# Patient Record
Sex: Male | Born: 2000 | Race: Black or African American | Hispanic: No | Marital: Single | Smoking: Current every day smoker
Health system: Southern US, Community
[De-identification: ages and names within clinical notes are randomized; demographics above are authoritative.]

## PROBLEM LIST (undated history)

## (undated) ENCOUNTER — Ambulatory Visit

## (undated) DIAGNOSIS — J45909 Unspecified asthma, uncomplicated: Secondary | ICD-10-CM

---
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
## 2018-05-09 ENCOUNTER — Emergency Department (HOSPITAL_COMMUNITY)
Admission: EM | Admit: 2018-05-09 | Discharge: 2018-05-09 | Disposition: A | Discharge status: Home / Self Care | Payer: Medicaid Other | Attending: Emergency Medicine | Admitting: Emergency Medicine

## 2018-05-09 ENCOUNTER — Encounter (HOSPITAL_COMMUNITY): Payer: Self-pay | Admitting: Emergency Medicine

## 2018-05-09 DIAGNOSIS — J45909 Unspecified asthma, uncomplicated: Secondary | ICD-10-CM | POA: Insufficient documentation

## 2018-05-09 DIAGNOSIS — N342 Other urethritis: Secondary | ICD-10-CM

## 2018-05-09 DIAGNOSIS — Z79899 Other long term (current) drug therapy: Secondary | ICD-10-CM | POA: Insufficient documentation

## 2018-05-09 MED ORDER — CEFTRIAXONE SODIUM 250 MG IJ SOLR
250.0000 mg | Freq: Once | INTRAMUSCULAR | Status: AC
  Administered 2018-05-09: 250 mg via INTRAMUSCULAR
  Filled 2018-05-09: qty 250

## 2018-05-09 MED ORDER — AZITHROMYCIN 250 MG PO TABS
1000.0000 mg | ORAL_TABLET | Freq: Once | ORAL | Status: AC
  Administered 2018-05-09: 1000 mg via ORAL
  Filled 2018-05-09: qty 4

## 2018-05-09 MED ORDER — LIDOCAINE HCL (PF) 1 % IJ SOLN
INTRAMUSCULAR | Status: AC
  Administered 2018-05-09: 04:00:00
  Filled 2018-05-09: qty 2

## 2018-05-09 NOTE — ED Notes (Signed)
Pt unable to urinate at this time. Pt given something to drink per his request.

## 2018-05-09 NOTE — ED Triage Notes (Signed)
Pt with c/o burning with urination since Sunday. States he has been having unprotected sex with a new partner.

## 2018-05-09 NOTE — ED Provider Notes (Signed)
[facility] EMERGENCY DEPARTMENT Provider Note   CSN: [id number] Arrival date & time: 05/09/18  0319     History   Chief Complaint Chief Complaint  Patient presents with  . Dysuria    HPI Alexander Galvan is a 17 y.o. male.  The history is provided by the patient.  Dysuria   This is a new problem. The current episode started more than 2 days ago. The problem occurs every urination. The problem has been gradually worsening. The quality of the pain is described as burning. The pain is moderate. There has been no fever. Pertinent negatives include no vomiting. He has tried nothing for the symptoms.  Patient reports recent unprotected sex, now having dysuria and penile discharge  Past Medical History:  Diagnosis Date  . Asthma     There are no active problems to display for this patient.   History reviewed. No pertinent surgical history.      Home Medications    Prior to Admission medications   Medication Sig Start Date End Date Taking? Authorizing Provider  albuterol (PROVENTIL HFA;VENTOLIN HFA) 108 (90 Base) MCG/ACT inhaler Inhale 2 puffs into the lungs every 6 (six) hours as needed for wheezing or shortness of breath.    [provider]  cyclobenzaprine (FLEXERIL) 10 MG tablet Take 1 tablet (10 mg total) by mouth 3 (three) times daily as needed. 11/28/17   Triplett, Tammy, PA-C  ibuprofen (ADVIL,MOTRIN) 800 MG tablet Take 1 tablet (800 mg total) by mouth 3 (three) times daily. 11/28/17   Triplett, Babette Relic, PA-C    Family History No family history on file.  Social History Social History   Tobacco Use  . Smoking status: Never Smoker  . Smokeless tobacco: Never Used  Substance Use Topics  . Alcohol use: No  . Drug use: No     Allergies   Patient has no known allergies.   Review of Systems Review of Systems  Constitutional: Negative for fever.  Gastrointestinal: Negative for vomiting.  Genitourinary: Positive for dysuria.     Physical Exam Updated  Vital Signs BP (!) 146/81 (BP Location: Right Arm)   Pulse 70   Temp 98.1 F (36.7 C) (Oral)   Resp 17   SpO2 98%   Physical Exam CONSTITUTIONAL: Well developed/well nourished HEAD: Normocephalic/atraumatic ENMT: Mucous membranes moist NECK: supple no meningeal signs ABDOMEN: soft GU: Whitish penile discharge noted.  No penile lesions.  Nurse present for exam He is uncircumcised NEURO: Pt is awake/alert/appropriate, moves all extremitiesx4.   EXTREMITIES:  full ROM SKIN: warm, color normal PSYCH: no abnormalities of mood noted, alert and oriented to situation   ED Treatments / Results  Labs (all labs ordered are listed, but only abnormal results are displayed) Labs Reviewed  GC/CHLAMYDIA PROBE AMP ([place]) NOT AT [facility]    EKG None  Radiology No results found.  Procedures Procedures (including critical care time)  Medications Ordered in ED Medications  cefTRIAXone (ROCEPHIN) injection 250 mg (250 mg Intramuscular Given 05/09/18 0407)  azithromycin (ZITHROMAX) tablet 1,000 mg (1,000 mg Oral Given 05/09/18 0407)  lidocaine (PF) (XYLOCAINE) 1 % injection (  Given 05/09/18 0412)     Initial Impression / Assessment and Plan / ED Course  I have reviewed the triage vital signs and the nursing notes.   We will empirically treat for gonorrhea chlamydia. Discussed safe sex precautions and need to use condoms every time  Final Clinical Impressions(s) / ED Diagnoses   Final diagnoses:  Urethritis  ED Discharge Orders    None       Zadie RhineWickline, Lunette Tapp, MD 05/09/18 [phone]

## 2018-05-10 LAB — GC/CHLAMYDIA PROBE AMP (~~LOC~~) NOT AT ARMC
CHLAMYDIA, DNA PROBE: NEGATIVE
Neisseria Gonorrhea: POSITIVE — AB

## 2019-10-04 ENCOUNTER — Other Ambulatory Visit: Payer: Self-pay

## 2019-10-04 DIAGNOSIS — Z20822 Contact with and (suspected) exposure to covid-19: Secondary | ICD-10-CM

## 2019-10-05 LAB — NOVEL CORONAVIRUS, NAA: SARS-CoV-2, NAA: NOT DETECTED

## 2020-06-19 ENCOUNTER — Encounter (HOSPITAL_COMMUNITY): Payer: Self-pay | Admitting: Emergency Medicine

## 2020-06-19 ENCOUNTER — Other Ambulatory Visit: Payer: Self-pay

## 2020-06-19 ENCOUNTER — Emergency Department (HOSPITAL_COMMUNITY)
Admission: EM | Admit: 2020-06-19 | Discharge: 2020-06-19 | Disposition: A | Discharge status: Home / Self Care | Payer: BC Managed Care – PPO | Attending: Emergency Medicine | Admitting: Emergency Medicine

## 2020-06-19 DIAGNOSIS — R1310 Dysphagia, unspecified: Secondary | ICD-10-CM | POA: Diagnosis present

## 2020-06-19 DIAGNOSIS — J45909 Unspecified asthma, uncomplicated: Secondary | ICD-10-CM | POA: Insufficient documentation

## 2020-06-19 DIAGNOSIS — K122 Cellulitis and abscess of mouth: Secondary | ICD-10-CM | POA: Diagnosis not present

## 2020-06-19 MED ORDER — CLINDAMYCIN HCL 300 MG PO CAPS: 300.0000 mg | ORAL_CAPSULE | Freq: Three times a day (TID) | ORAL | 0 refills | Status: DC

## 2020-06-19 MED ORDER — PREDNISONE 50 MG PO TABS: ORAL_TABLET | ORAL | 0 refills | Status: DC

## 2020-06-19 MED ORDER — CLINDAMYCIN HCL 150 MG PO CAPS
300.0000 mg | ORAL_CAPSULE | Freq: Once | ORAL | Status: AC
  Administered 2020-06-19: 300 mg via ORAL
  Filled 2020-06-19: qty 2

## 2020-06-19 MED ORDER — DEXAMETHASONE 4 MG PO TABS
10.0000 mg | ORAL_TABLET | Freq: Once | ORAL | Status: AC
  Administered 2020-06-19: 10 mg via ORAL
  Filled 2020-06-19: qty 3

## 2020-06-19 NOTE — ED Triage Notes (Signed)
Pt reports he woke up with pain to the top of his mouth

## 2020-06-19 NOTE — ED Provider Notes (Signed)
[facility] EMERGENCY DEPARTMENT Provider Note   CSN: [id number] Arrival date & time: 06/19/20  0411     History Chief Complaint  Patient presents with  . mouth pain    Alexander Galvan is a 19 y.o. male.  The history is provided by the patient and a significant other.  Patient reports he woke up with difficulty swallowing.  He reports he went to bed in his usual health.  He reports he woke up and he felt there was something in the back of his throat.  He then noted that there was swelling in the back of his throat.  There is only mild pain.  No fevers or vomiting. He is able to swallow his fluids.  He has never had this before.  He is not taking daily medications.  No chest pain or shortness of breath.  No rash.  No known allergic reactions His course is stable.  It is worsened by swallowing     Past Medical History:  Diagnosis Date  . Asthma     There are no problems to display for this patient.   History reviewed. No pertinent surgical history.     No family history on file.  Social History   Tobacco Use  . Smoking status: Never Smoker  . Smokeless tobacco: Never Used  Vaping Use  . Vaping Use: Every day  Substance Use Topics  . Alcohol use: No  . Drug use: No    Home Medications Prior to Admission medications   Medication Sig Start Date End Date Taking? Authorizing Provider  clindamycin (CLEOCIN) 300 MG capsule Take 1 capsule (300 mg total) by mouth 3 (three) times daily. X 7 days 06/19/20   Zadie Rhine, MD  predniSONE (DELTASONE) 50 MG tablet One tablet PO daily for 4 days - start on Sunday 7/25 06/19/20   Zadie Rhine, MD  albuterol (PROVENTIL HFA;VENTOLIN HFA) 108 (90 Base) MCG/ACT inhaler Inhale 2 puffs into the lungs every 6 (six) hours as needed for wheezing or shortness of breath.  06/19/20  [provider]    Allergies    Patient has no known allergies.  Review of Systems   Review of Systems  Constitutional: Negative for fever.    HENT: Positive for sore throat and trouble swallowing. Negative for facial swelling.   Respiratory: Negative for shortness of breath.   Cardiovascular: Negative for chest pain.  Gastrointestinal: Negative for vomiting.  Skin: Negative for rash.  All other systems reviewed and are negative.   Physical Exam Updated Vital Signs BP (!) 135/85 (BP Location: Left Arm)   Pulse 63   Temp 97.7 F (36.5 C) (Oral)   Resp 16   Ht 1.753 m (5\' 9" )   Wt (!) 113.4 kg   SpO2 96%   BMI 36.92 kg/m   Physical Exam CONSTITUTIONAL: Well developed/well nourished HEAD: Normocephalic/atraumatic EYES: EOMI/PERRL ENMT: Mucous membranes moist, there is no lip or tongue swelling.  Uvula is mildly  enlarged and erythematous.  No drooling.  No stridor.  No trismus.  Normal phonation is noted NECK: supple no meningeal signs CV: S1/S2 noted, no murmurs/rubs/gallops noted LUNGS: Lungs are clear to auscultation bilaterally, no apparent distress ABDOMEN: soft, nontender NEURO: Pt is awake/alert/appropriate, moves all extremitiesx4.  No facial droop.   EXTREMITIES: pulses normal/equal, full ROM SKIN: warm, color normal PSYCH: no abnormalities of mood noted, alert and oriented to situation ED Results / Procedures / Treatments   Labs (all labs ordered are listed, but only abnormal  results are displayed) Labs Reviewed - No data to display  EKG None  Radiology No results found.  Procedures Procedures  Medications Ordered in ED Medications  dexamethasone (DECADRON) tablet 10 mg (10 mg Oral Given 06/19/20 0622)  clindamycin (CLEOCIN) capsule 300 mg (300 mg Oral Given 06/19/20 5400)    ED Course  I have reviewed the triage vital signs and the nursing notes.      MDM Rules/Calculators/A&P                          Pt with Uvulitis of unclear etiology. Overall well-appearing.  He took medicines and PO fluids without difficulty.  No drooling or stridor.  Patient is requesting discharge home.  Will  start on steroids as well as antibiotics. We discussed return precautions.  Final Clinical Impression(s) / ED Diagnoses Final diagnoses:  Uvulitis    Rx / DC Orders ED Discharge Orders         Ordered    clindamycin (CLEOCIN) 300 MG capsule  3 times daily     Discontinue  Reprint     06/19/20 0603    predniSONE (DELTASONE) 50 MG tablet     Discontinue  Reprint     06/19/20 0603           Zadie Rhine, MD 06/19/20 [phone]

## 2021-01-28 ENCOUNTER — Other Ambulatory Visit: Payer: Self-pay

## 2021-01-28 ENCOUNTER — Encounter: Payer: Self-pay | Admitting: Emergency Medicine

## 2021-01-28 ENCOUNTER — Ambulatory Visit
Admission: EM | Admit: 2021-01-28 | Discharge: 2021-01-28 | Disposition: A | Discharge status: Home / Self Care | Payer: BC Managed Care – PPO | Attending: Emergency Medicine | Admitting: Emergency Medicine

## 2021-01-28 DIAGNOSIS — R300 Dysuria: Secondary | ICD-10-CM | POA: Diagnosis present

## 2021-01-28 DIAGNOSIS — Z202 Contact with and (suspected) exposure to infections with a predominantly sexual mode of transmission: Secondary | ICD-10-CM | POA: Insufficient documentation

## 2021-01-28 LAB — POCT URINALYSIS DIP (MANUAL ENTRY)
Bilirubin, UA: NEGATIVE
Blood, UA: NEGATIVE
Glucose, UA: NEGATIVE mg/dL
Nitrite, UA: NEGATIVE
Spec Grav, UA: >=1.03 — AB (ref 1.010–1.025)
Urobilinogen, UA: 1 E.U./dL
pH, UA: 6.5 (ref 5.0–8.0)

## 2021-01-28 MED ORDER — PHENAZOPYRIDINE HCL 100 MG PO TABS: 100.0000 mg | ORAL_TABLET | Freq: Three times a day (TID) | ORAL | 0 refills | Status: DC | PRN

## 2021-01-28 NOTE — ED Provider Notes (Addendum)
[facility] CARE CENTER   [id number] 01/28/21 Arrival Time: 1512   Chief Complaint  Patient presents with  . Dysuria    SUBJECTIVE:  Alexander Galvan is a 20 y.o. male who presented to the urgent care with a complaint of dysuria for the past 2 weeks.  Denies precipitating event, recent sexual encounter or recent antibiotic use.Marland Kitchen  He is sexually active with 1 male partner.  Denies penile discharge.  Has tried OTC medication with no relief.  Denies any alleviating or aggravating factors.  Denies similar symptoms in the past.  Denies fever, chills, nausea, vomiting, abdominal or pelvic pain, urinary symptoms, dyspareunia, penile rashes or lesions.   No LMP for male patient. Current birth control method: Compliant with BC:  ROS: As per HPI.  All other pertinent ROS negative.     Past Medical History:  Diagnosis Date  . Asthma    History reviewed. No pertinent surgical history. No Known Allergies No current facility-administered medications on file prior to encounter.   Current Outpatient Medications on File Prior to Encounter  Medication Sig Dispense Refill  . clindamycin (CLEOCIN) 300 MG capsule Take 1 capsule (300 mg total) by mouth 3 (three) times daily. X 7 days 21 capsule 0  . predniSONE (DELTASONE) 50 MG tablet One tablet po daily 4 tablet 0  . [DISCONTINUED] albuterol (PROVENTIL HFA;VENTOLIN HFA) 108 (90 Base) MCG/ACT inhaler Inhale 2 puffs into the lungs every 6 (six) hours as needed for wheezing or shortness of breath.      Social History   Socioeconomic History  . Marital status: Single    Spouse name: Not on file  . Number of children: Not on file  . Years of education: Not on file  . Highest education level: Not on file  Occupational History  . Not on file  Tobacco Use  . Smoking status: Never Smoker  . Smokeless tobacco: Never Used  Vaping Use  . Vaping Use: Every day  Substance and Sexual Activity  . Alcohol use: No  . Drug use: No  . Sexual activity: Not  on file  Other Topics Concern  . Not on file  Social History Narrative  . Not on file   Social Determinants of Health   Financial Resource Strain: Not on file  Food Insecurity: Not on file  Transportation Needs: Not on file  Physical Activity: Not on file  Stress: Not on file  Social Connections: Not on file  Intimate Partner Violence: Not on file   No family history on file.  OBJECTIVE:  Vitals:   01/28/21 1524  BP: (!) 149/73  Pulse: 75  Resp: 16  Temp: 98 F (36.7 C)  TempSrc: Oral  SpO2: 97%     General appearance: Alert, NAD, appears stated age Head: NCAT Throat: lips, mucosa, and tongue normal; teeth and gums normal Lungs: CTA bilaterally without adventitious breath sounds Heart: regular rate and rhythm.  Radial pulses 2+ symmetrical bilaterally Back: no CVA tenderness Abdomen: soft, non-tender; bowel sounds normal; no masses or organomegaly; no guarding or rebound tenderness GU: Deferred.  Penile self swab obtained Skin: warm and dry Psychological:  Alert and cooperative. Normal mood and affect.  LABS:  Results for orders placed or performed during the hospital encounter of 01/28/21  POCT urinalysis dipstick  Result Value Ref Range   Color, UA yellow yellow   Clarity, UA clear clear   Glucose, UA negative negative mg/dL   Bilirubin, UA negative negative   Ketones, POC UA moderate (40) (  A) negative mg/dL   Spec Grav, UA >=8.916 (A) 1.010 - 1.025   Blood, UA negative negative   pH, UA 6.5 5.0 - 8.0   Protein Ur, POC trace (A) negative mg/dL   Urobilinogen, UA 1.0 0.2 or 1.0 E.U./dL   Nitrite, UA Negative Negative   Leukocytes, UA Trace (A) Negative    Labs Reviewed  POCT URINALYSIS DIP (MANUAL ENTRY) - Abnormal; Notable for the following components:      Result Value   Ketones, POC UA moderate (40) (*)    Spec Grav, UA >=1.030 (*)    Protein Ur, POC trace (*)    Leukocytes, UA Trace (*)    All other components within normal limits  URINE  CULTURE  CYTOLOGY, (ORAL, ANAL, URETHRAL) ANCILLARY ONLY    ASSESSMENT & PLAN:  1. Dysuria   2. STD exposure     Meds ordered this encounter  Medications  . phenazopyridine (PYRIDIUM) 100 MG tablet    Sig: Take 1 tablet (100 mg total) by mouth 3 (three) times daily as needed for pain.    Dispense:  10 tablet    Refill:  0    Pending: Labs Reviewed  POCT URINALYSIS DIP (MANUAL ENTRY) - Abnormal; Notable for the following components:      Result Value   Ketones, POC UA moderate (40) (*)    Spec Grav, UA >=1.030 (*)    Protein Ur, POC trace (*)    Leukocytes, UA Trace (*)    All other components within normal limits  URINE CULTURE  CYTOLOGY, (ORAL, ANAL, URETHRAL) ANCILLARY ONLY     Discharge instructions  Penile self -swab obtained.  We will follow up with you regarding abnormal results Prescribed Pyridium take as directed Take medications as prescribed and to completion If tests results are positive, please abstain from sexual activity until you and your partner(s) have been treated Follow up with PCP or [facility] if symptoms persists Return here or go to ER if you have any new or worsening symptoms fever, chills, nausea, vomiting, abdominal or pelvic pain, painful intercourse, vaginal discharge, vaginal bleeding, persistent symptoms despite treatment, etc...  Reviewed expectations re: course of current medical issues. Questions answered. Outlined signs and symptoms indicating need for more acute intervention. Patient verbalized understanding. After Visit Summary given.       Durward Parcel, FNP 01/28/21 1603    Durward Parcel, FNP 01/28/21 [phone]

## 2021-01-28 NOTE — Discharge Instructions (Addendum)
Penile self -swab obtained.  We will follow up with you regarding abnormal results Prescribed Pyridium take as directed Take medications as prescribed and to completion If tests results are positive, please abstain from sexual activity until you and your partner(s) have been treated Follow up with PCP or [facility] if symptoms persists Return here or go to ER if you have any new or worsening symptoms fever, chills, nausea, vomiting, abdominal or pelvic pain, painful intercourse, vaginal discharge, vaginal bleeding, persistent symptoms despite treatment, etc..Marland Kitchen

## 2021-01-28 NOTE — ED Triage Notes (Signed)
Burning and itching with urination x 2 weeks

## 2021-01-30 LAB — URINE CULTURE: Culture: NO GROWTH

## 2021-02-01 LAB — CYTOLOGY, (ORAL, ANAL, URETHRAL) ANCILLARY ONLY
Chlamydia: POSITIVE — AB
Comment: NEGATIVE
Comment: NEGATIVE
Comment: NORMAL
Neisseria Gonorrhea: NEGATIVE
Trichomonas: NEGATIVE

## 2021-02-02 ENCOUNTER — Telehealth (HOSPITAL_COMMUNITY): Payer: Self-pay | Admitting: Emergency Medicine

## 2021-02-02 MED ORDER — DOXYCYCLINE HYCLATE 100 MG PO CAPS: 100.0000 mg | ORAL_CAPSULE | Freq: Two times a day (BID) | ORAL | 0 refills | Status: AC

## 2021-09-19 ENCOUNTER — Encounter (HOSPITAL_COMMUNITY): Payer: Self-pay

## 2021-09-19 ENCOUNTER — Emergency Department (HOSPITAL_COMMUNITY)
Admission: EM | Admit: 2021-09-19 | Discharge: 2021-09-19 | Disposition: A | Discharge status: Home / Self Care | Payer: BC Managed Care – PPO | Attending: Emergency Medicine | Admitting: Emergency Medicine

## 2021-09-19 ENCOUNTER — Emergency Department (HOSPITAL_COMMUNITY): Payer: BC Managed Care – PPO

## 2021-09-19 ENCOUNTER — Other Ambulatory Visit: Payer: Self-pay

## 2021-09-19 DIAGNOSIS — S0990XA Unspecified injury of head, initial encounter: Secondary | ICD-10-CM | POA: Diagnosis not present

## 2021-09-19 DIAGNOSIS — Y9389 Activity, other specified: Secondary | ICD-10-CM | POA: Insufficient documentation

## 2021-09-19 DIAGNOSIS — S20211A Contusion of right front wall of thorax, initial encounter: Secondary | ICD-10-CM | POA: Insufficient documentation

## 2021-09-19 DIAGNOSIS — Y92410 Unspecified street and highway as the place of occurrence of the external cause: Secondary | ICD-10-CM | POA: Insufficient documentation

## 2021-09-19 DIAGNOSIS — J45909 Unspecified asthma, uncomplicated: Secondary | ICD-10-CM | POA: Diagnosis not present

## 2021-09-19 DIAGNOSIS — Z79899 Other long term (current) drug therapy: Secondary | ICD-10-CM | POA: Insufficient documentation

## 2021-09-19 DIAGNOSIS — S161XXA Strain of muscle, fascia and tendon at neck level, initial encounter: Secondary | ICD-10-CM | POA: Insufficient documentation

## 2021-09-19 DIAGNOSIS — S199XXA Unspecified injury of neck, initial encounter: Secondary | ICD-10-CM | POA: Diagnosis present

## 2021-09-19 MED ORDER — IBUPROFEN 800 MG PO TABS: 800.0000 mg | ORAL_TABLET | Freq: Three times a day (TID) | ORAL | 0 refills | Status: DC | PRN

## 2021-09-19 MED ORDER — METHOCARBAMOL 500 MG PO TABS: 500.0000 mg | ORAL_TABLET | Freq: Two times a day (BID) | ORAL | 0 refills | Status: DC | PRN
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
## 2021-11-18 ENCOUNTER — Other Ambulatory Visit: Payer: Self-pay

## 2021-11-18 ENCOUNTER — Encounter (HOSPITAL_COMMUNITY): Payer: Self-pay | Admitting: Emergency Medicine

## 2021-11-18 ENCOUNTER — Emergency Department (HOSPITAL_COMMUNITY)
Admission: EM | Admit: 2021-11-18 | Discharge: 2021-11-18 | Disposition: A | Discharge status: Home / Self Care | Payer: BC Managed Care – PPO | Attending: Emergency Medicine | Admitting: Emergency Medicine

## 2021-11-18 DIAGNOSIS — J45909 Unspecified asthma, uncomplicated: Secondary | ICD-10-CM | POA: Diagnosis not present

## 2021-11-18 DIAGNOSIS — F17210 Nicotine dependence, cigarettes, uncomplicated: Secondary | ICD-10-CM | POA: Diagnosis not present

## 2021-11-18 DIAGNOSIS — Z202 Contact with and (suspected) exposure to infections with a predominantly sexual mode of transmission: Secondary | ICD-10-CM | POA: Diagnosis present

## 2021-11-18 NOTE — ED Triage Notes (Signed)
Pt states "I was told to come here and get tested for STDs". Pt denies experiencing any symptoms at this time.

## 2021-11-18 NOTE — Discharge Instructions (Signed)
You were seen in the emerge department today after potential exposure to an STD for sexually transmitted disease.  We are sending urine and blood tests which will come back in the next several days.  The results will come back in the MyChart app.  If any of these come back positive, you will be called to return for treatment.  Please use barrier protection or abstinence until your test results come back and alert any other partner(s) that you are undergoing testing so that they too can be tested.

## 2021-11-18 NOTE — ED Provider Notes (Signed)
Emergency Department Provider Note   I have reviewed the triage vital signs and the nursing notes.   HISTORY  Chief Complaint Exposure to STD   HPI Alexander Galvan is a 20 y.o. male with PMH of asthma presents to the emergency department for evaluation of potential STD exposure.  Patient is not having symptoms but states that a recent sexual partner called him and told him to go get tested.  He tells me they did not tell him that they had been specifically diagnosed with anything but that he should get tested.  Denies any dysuria, hesitancy, urethral discharge.  No testicular pain. Last HIV test was 6 months prior.   Past Medical History:  Diagnosis Date   Asthma     There are no problems to display for this patient.   History reviewed. No pertinent surgical history.  Allergies Patient has no known allergies.  History reviewed. No pertinent family history.  Social History Social History   Tobacco Use   Smoking status: Every Day    Packs/day: 0.50    Types: Cigarettes   Smokeless tobacco: Never  Vaping Use   Vaping Use: Every day  Substance Use Topics   Alcohol use: No   Drug use: No    Review of Systems  Constitutional: No fever/chills Eyes: No visual changes. ENT: No sore throat. Cardiovascular: Denies chest pain. Respiratory: Denies shortness of breath. Gastrointestinal: No abdominal pain.  No nausea, no vomiting.  No diarrhea.  No constipation. Genitourinary: Negative for dysuria. Musculoskeletal: Negative for back pain. Skin: Negative for rash. Neurological: Negative for headaches, focal weakness or numbness.  10-point ROS otherwise negative.  ____________________________________________   PHYSICAL EXAM:  VITAL SIGNS: ED Triage Vitals  Enc Vitals Group     BP 11/18/21 0835 (!) 170/85     Pulse Rate 11/18/21 0835 89     Resp 11/18/21 0835 18     Temp 11/18/21 0835 98.5 F (36.9 C)     Temp Source 11/18/21 0835 Oral     SpO2 11/18/21 0835  99 %     Weight 11/18/21 0836 255 lb (115.7 kg)     Height 11/18/21 0836 5\' 9"  (1.753 m)    Constitutional: Alert and oriented. Well appearing and in no acute distress. Eyes: Conjunctivae are normal.  Head: Atraumatic. Nose: No congestion/rhinnorhea. Neck: No stridor.   Cardiovascular:  Good peripheral circulation.   Respiratory: Normal respiratory effort.   Gastrointestinal: No distention.  Musculoskeletal: No gross deformities of extremities. Neurologic:  Normal speech and language. Skin: No rash noted.   ____________________________________________   LABS (all labs ordered are listed, but only abnormal results are displayed)  Labs Reviewed  GC/CHLAMYDIA PROBE AMP ([place]) NOT AT [facility]   ____________________________________________   PROCEDURES  Procedure(s) performed:   Procedures  None  ____________________________________________   INITIAL IMPRESSION / ASSESSMENT AND PLAN / ED COURSE  Pertinent labs & imaging results that were available during my care of the patient were reviewed by me and considered in my medical decision making (see chart for details).   Patient presents emergency department for evaluation of STD testing.  No known exposure but was called by partner advising testing but not of a specific diagnosis or exposure.  Urine gonorrhea and chlamydia have been sent along with HIV and RPR.  Patient advised to either remain abstinent or use barrier contraception consistently and correctly until his test results come back.  He will follow the test results in the MyChart app, which  he has on his phone. No empiric treatment here without symptoms.    ____________________________________________  FINAL CLINICAL IMPRESSION(S) / ED DIAGNOSES  Final diagnoses:  STD exposure    Note:  This document was prepared using Dragon voice recognition software and may include unintentional dictation errors.  Alona Bene, MD, [facility]    Arthur Speagle,  Arlyss Repress, MD 11/19/21 2207

## 2021-11-19 LAB — GC/CHLAMYDIA PROBE AMP (~~LOC~~) NOT AT ARMC
Chlamydia: NEGATIVE
Comment: NEGATIVE
Comment: NORMAL
Neisseria Gonorrhea: NEGATIVE
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
## 2023-10-04 ENCOUNTER — Ambulatory Visit
Admission: EM | Admit: 2023-10-04 | Discharge: 2023-10-04 | Disposition: A | Discharge status: Home / Self Care | Payer: BC Managed Care – PPO | Attending: Family Medicine | Admitting: Family Medicine

## 2023-10-04 DIAGNOSIS — H66001 Acute suppurative otitis media without spontaneous rupture of ear drum, right ear: Secondary | ICD-10-CM

## 2023-10-04 MED ORDER — AMOXICILLIN 875 MG PO TABS: 875.0000 mg | ORAL_TABLET | Freq: Two times a day (BID) | ORAL | 0 refills | Status: DC

## 2023-10-04 MED ORDER — FLUTICASONE PROPIONATE 50 MCG/ACT NA SUSP: 1.0000 | Freq: Two times a day (BID) | NASAL | 2 refills | Status: DC

## 2023-10-04 NOTE — ED Provider Notes (Signed)
[facility]    CSN: [id number] Arrival date & time: 10/04/23  1714      History   Chief Complaint No chief complaint on file.   HPI Alexander Galvan is a 22 y.o. male.   Patient presenting today with 1 day history of sharp stabbing right ear pain that is now radiating toward the side of his head.  Muffled hearing but no drainage, fever, injury to the area, congestion, cough.  So far not trying anything over-the-counter for symptoms.    Past Medical History:  Diagnosis Date   Asthma     There are no problems to display for this patient.   History reviewed. No pertinent surgical history.     Home Medications    Prior to Admission medications   Medication Sig Start Date End Date Taking? Authorizing Provider  amoxicillin (AMOXIL) 875 MG tablet Take 1 tablet (875 mg total) by mouth 2 (two) times daily. 10/04/23  Yes Particia Nearing, PA-C  fluticasone [facility]) 50 MCG/ACT nasal spray Place 1 spray into both nostrils 2 (two) times daily. 10/04/23  Yes Particia Nearing, PA-C  clindamycin (CLEOCIN) 300 MG capsule Take 1 capsule (300 mg total) by mouth 3 (three) times daily. X 7 days 06/19/20   Zadie Rhine, MD  ibuprofen (ADVIL) 800 MG tablet Take 1 tablet (800 mg total) by mouth every 8 (eight) hours as needed. 09/19/21   Terrilee Files, MD  methocarbamol (ROBAXIN) 500 MG tablet Take 1 tablet (500 mg total) by mouth 2 (two) times daily as needed for muscle spasms. 09/19/21   Terrilee Files, MD  phenazopyridine (PYRIDIUM) 100 MG tablet Take 1 tablet (100 mg total) by mouth 3 (three) times daily as needed for pain. 01/28/21   Avegno, Zachery Dakins, FNP  predniSONE (DELTASONE) 50 MG tablet One tablet po daily 06/19/20   Zadie Rhine, MD  albuterol (PROVENTIL HFA;VENTOLIN HFA) 108 (90 Base) MCG/ACT inhaler Inhale 2 puffs into the lungs every 6 (six) hours as needed for wheezing or shortness of breath.  06/19/20  [provider]    Family  History History reviewed. No pertinent family history.  Social History Social History   Tobacco Use   Smoking status: Every Day    Current packs/day: 0.50    Types: Cigarettes   Smokeless tobacco: Never  Vaping Use   Vaping status: Every Day  Substance Use Topics   Alcohol use: No   Drug use: No     Allergies   Patient has no known allergies.   Review of Systems Review of Systems Per HPI  Physical Exam Triage Vital Signs ED Triage Vitals  Encounter Vitals Group     BP 10/04/23 1804 (!) 146/90     Systolic BP Percentile --      Diastolic BP Percentile --      Pulse Rate 10/04/23 1804 71     Resp 10/04/23 1804 16     Temp 10/04/23 1804 98.1 F (36.7 C)     Temp Source 10/04/23 1804 Oral     SpO2 10/04/23 1804 95 %     Weight --      Height --      Head Circumference --      Peak Flow --      Pain Score 10/04/23 1806 10     Pain Loc --      Pain Education --      Exclude from Growth Chart --    No data  found.  Updated Vital Signs BP (!) 146/90 (BP Location: Right Arm)   Pulse 71   Temp 98.1 F (36.7 C) (Oral)   Resp 16   SpO2 95%   Visual Acuity Right Eye Distance:   Left Eye Distance:   Bilateral Distance:    Right Eye Near:   Left Eye Near:    Bilateral Near:     Physical Exam Vitals and nursing note reviewed.  Constitutional:      Appearance: Normal appearance.  HENT:     Head: Atraumatic.     Left Ear: Tympanic membrane and external ear normal.     Ears:     Comments: Right TM significantly erythematous, edematous    Nose: Nose normal.     Mouth/Throat:     Mouth: Mucous membranes are moist.     Pharynx: Oropharynx is clear.  Eyes:     Extraocular Movements: Extraocular movements intact.     Conjunctiva/sclera: Conjunctivae normal.  Cardiovascular:     Rate and Rhythm: Normal rate and regular rhythm.  Pulmonary:     Effort: Pulmonary effort is normal.     Breath sounds: Normal breath sounds.  Musculoskeletal:        General:  Normal range of motion.     Cervical back: Normal range of motion and neck supple.  Skin:    General: Skin is warm and dry.  Neurological:     General: No focal deficit present.     Mental Status: He is oriented to person, place, and time.  Psychiatric:        Mood and Affect: Mood normal.        Thought Content: Thought content normal.        Judgment: Judgment normal.      UC Treatments / Results  Labs (all labs ordered are listed, but only abnormal results are displayed) Labs Reviewed - No data to display  EKG   Radiology No results found.  Procedures Procedures (including critical care time)  Medications Ordered in UC Medications - No data to display  Initial Impression / Assessment and Plan / UC Course  I have reviewed the triage vital signs and the nursing notes.  Pertinent labs & imaging results that were available during my care of the patient were reviewed by me and considered in my medical decision making (see chart for details).     Treat with amoxicillin, Flonase, supportive over-the-counter medications and home care.  Return for worsening symptoms.  Final Clinical Impressions(s) / UC Diagnoses   Final diagnoses:  Acute suppurative otitis media of right ear without spontaneous rupture of tympanic membrane, recurrence not specified     Discharge Instructions      I have sent over an antibiotic as well as Flonase nasal spray to help with the internal pressure.  Complete the full course of antibiotic even once you start feeling better.  Ibuprofen and Tylenol as needed for pain.    ED Prescriptions     Medication Sig Dispense Auth. Provider   amoxicillin (AMOXIL) 875 MG tablet Take 1 tablet (875 mg total) by mouth 2 (two) times daily. 20 tablet Particia Nearing, PA-C   fluticasone [facility]) 50 MCG/ACT nasal spray Place 1 spray into both nostrils 2 (two) times daily. 16 g Particia Nearing, [place]      PDMP not reviewed this encounter.    Particia Nearing, [place] 10/04/23 1900

## 2023-10-04 NOTE — ED Triage Notes (Signed)
Pt c/o right side facial swelling and ear pain onset this morning pt states he felt like it ws something in his ear at first, and now has a headache.

## 2023-10-04 NOTE — Discharge Instructions (Addendum)
I have sent over an antibiotic as well as Flonase nasal spray to help with the internal pressure.  Complete the full course of antibiotic even once you start feeling better.  Ibuprofen and Tylenol as needed for pain.

## 2024-10-08 ENCOUNTER — Ambulatory Visit
Admission: EM | Admit: 2024-10-08 | Discharge: 2024-10-08 | Disposition: A | Discharge status: Home / Self Care | Attending: Nurse Practitioner | Admitting: Nurse Practitioner

## 2024-10-08 DIAGNOSIS — L293 Anogenital pruritus, unspecified: Secondary | ICD-10-CM | POA: Diagnosis not present

## 2024-10-08 DIAGNOSIS — N489 Disorder of penis, unspecified: Secondary | ICD-10-CM | POA: Diagnosis present

## 2024-10-08 NOTE — ED Provider Notes (Signed)
 [facility]    CSN: [id number] Arrival date & time: 10/08/24  1512      History   Chief Complaint No chief complaint on file.   HPI Alexander Galvan is a 23 y.o. male.   Patient today for 1 week history of penile itching and irritation.  Reports there is a spot on the right side of his penis he is concerned about.  Reports when he has had similar symptoms, he has tested positive for STI and is requesting testing today.  He denies any known exposures to STI.  No testicular pain, groin swelling, burning with urination, increased urinary frequency, or urinary hesitancy.  No penile discharge, abdominal pain, pelvic pain.  History of gonorrhea and chlamydia.    Past Medical History:  Diagnosis Date   Asthma     There are no active problems to display for this patient.   History reviewed. No pertinent surgical history.     Home Medications    Prior to Admission medications   Medication Sig Start Date End Date Taking? Authorizing Provider  albuterol (PROVENTIL HFA;VENTOLIN HFA) 108 (90 Base) MCG/ACT inhaler Inhale 2 puffs into the lungs every 6 (six) hours as needed for wheezing or shortness of breath.  06/19/20  [provider]    Family History History reviewed. No pertinent family history.  Social History Social History   Tobacco Use   Smoking status: Every Day    Current packs/day: 0.50    Types: Cigarettes   Smokeless tobacco: Never  Vaping Use   Vaping status: Every Day  Substance Use Topics   Alcohol use: No   Drug use: No     Allergies   Patient has no known allergies.   Review of Systems Review of Systems Per HPI  Physical Exam Triage Vital Signs ED Triage Vitals [10/08/24 1520]  Encounter Vitals Group     BP (!) 145/85     Girls Systolic BP Percentile      Girls Diastolic BP Percentile      Boys Systolic BP Percentile      Boys Diastolic BP Percentile      Pulse Rate 86     Resp 16     Temp 98.1 F (36.7 C)      Temp Source Oral     SpO2 93 %     Weight      Height      Head Circumference      Peak Flow      Pain Score 0     Pain Loc      Pain Education      Exclude from Growth Chart    No data found.  Updated Vital Signs BP (!) 145/85 (BP Location: Right Arm)   Pulse 86   Temp 98.1 F (36.7 C) (Oral)   Resp 16   SpO2 93%   Visual Acuity Right Eye Distance:   Left Eye Distance:   Bilateral Distance:    Right Eye Near:   Left Eye Near:    Bilateral Near:     Physical Exam Vitals and nursing note reviewed. Exam conducted with a chaperone present Evonne Compton, CMA).  Constitutional:      General: He is not in acute distress.    Appearance: Normal appearance. He is not toxic-appearing.  Pulmonary:     Effort: Pulmonary effort is normal. No respiratory distress.  Genitourinary:    Pubic Area: No rash.      Penis: Lesions present.  Comments: Tiny, ulcerated lesion noted to right side of penis in approximately area marked Lymphadenopathy:     Lower Body: No right inguinal adenopathy. No left inguinal adenopathy.  Skin:    General: Skin is warm and dry.     Capillary Refill: Capillary refill takes less than 2 seconds.     Coloration: Skin is not jaundiced or pale.  Neurological:     Mental Status: He is alert and oriented to person, place, and time.     Motor: No weakness.     Gait: Gait normal.  Psychiatric:        Behavior: Behavior is cooperative.      UC Treatments / Results  Labs (all labs ordered are listed, but only abnormal results are displayed) Labs Reviewed  HIV ANTIBODY (ROUTINE TESTING W REFLEX)  RPR  HSV 1/2 PCR (SURFACE)  CYTOLOGY, (ORAL, ANAL, URETHRAL) ANCILLARY ONLY    EKG   Radiology No results found.  Procedures Procedures (including critical care time)  Medications Ordered in UC Medications - No data to display  Initial Impression / Assessment and Plan / UC Course  I have reviewed the triage vital signs and the nursing  notes.  Pertinent labs & imaging results that were available during my care of the patient were reviewed by me and considered in my medical decision making (see chart for details).   Patient is well-appearing, normotensive, afebrile, not tachycardic, not tachypneic, oxygenating well on room air.   1. Penile lesion HSV swab obtained Treat as indicated if still symptomatic when results return Safe sex practices discussed  2. Itching of penis Pending for gonorrhea, chlamydia, trichomonas HIV and RPR also pending per patient request Safe sex practices discussed  The patient was given the opportunity to ask questions.  All questions answered to their satisfaction.  The patient is in agreement to this plan.   Final Clinical Impressions(s) / UC Diagnoses   Final diagnoses:  Penile lesion  Itching of penis     Discharge Instructions      We will contact you with any positive test results.  Recommend avoidance of sexual intercourse until you are aware of your results.  Also recommend condom use with every sexual counter to prevent STI.    ED Prescriptions   None    PDMP not reviewed this encounter.   Chandra Harlene LABOR, NP 10/08/24 [phone]

## 2024-10-08 NOTE — ED Triage Notes (Signed)
 Pt repots to UC for STD testing. States he has some penile itching and irritation x 2 weeks  Also requesting a blood panel for STI's as well.

## 2024-10-08 NOTE — Discharge Instructions (Signed)
 We will contact you with any positive test results.  Recommend avoidance of sexual intercourse until you are aware of your results.  Also recommend condom use with every sexual counter to prevent STI.

## 2024-10-09 LAB — HIV ANTIBODY (ROUTINE TESTING W REFLEX): HIV Screen 4th Generation wRfx: NONREACTIVE

## 2024-10-09 LAB — CYTOLOGY, (ORAL, ANAL, URETHRAL) ANCILLARY ONLY
Chlamydia: NEGATIVE
Comment: NEGATIVE
Comment: NEGATIVE
Comment: NORMAL
Neisseria Gonorrhea: NEGATIVE
Trichomonas: NEGATIVE

## 2024-10-09 LAB — HSV 1/2 PCR (SURFACE)
HSV-1 DNA: NOT DETECTED
HSV-2 DNA: NOT DETECTED

## 2024-10-09 LAB — RPR: RPR Ser Ql: NONREACTIVE
